# Patient Record
Sex: Female | Born: 1960 | Race: White | Hispanic: No | Marital: Married | Smoking: Former smoker
Health system: Southern US, Community
[De-identification: ages and names within clinical notes are randomized; demographics above are authoritative.]

## PROBLEM LIST (undated history)

## (undated) DIAGNOSIS — E079 Disorder of thyroid, unspecified: Secondary | ICD-10-CM

## (undated) DIAGNOSIS — R011 Cardiac murmur, unspecified: Secondary | ICD-10-CM

## (undated) DIAGNOSIS — IMO0002 Reserved for concepts with insufficient information to code with codable children: Secondary | ICD-10-CM

## (undated) HISTORY — DX: Disorder of thyroid, unspecified: E07.9

## (undated) HISTORY — PX: COSMETIC SURGERY: SHX468

## (undated) HISTORY — PX: KNEE ARTHROSCOPY: SUR90

## (undated) HISTORY — DX: Reserved for concepts with insufficient information to code with codable children: IMO0002

## (undated) HISTORY — PX: THYROID SURGERY: SHX805

## (undated) HISTORY — DX: Cardiac murmur, unspecified: R01.1

---

## 2011-11-09 ENCOUNTER — Ambulatory Visit: Payer: Managed Care, Other (non HMO)

## 2011-11-09 ENCOUNTER — Ambulatory Visit (INDEPENDENT_AMBULATORY_CARE_PROVIDER_SITE_OTHER): Payer: Managed Care, Other (non HMO) | Admitting: Emergency Medicine

## 2011-11-09 VITALS — BP 110/68 | HR 73 | Temp 98.3°F | Resp 18 | Ht 65.0 in | Wt 154.8 lb

## 2011-11-09 DIAGNOSIS — L989 Disorder of the skin and subcutaneous tissue, unspecified: Secondary | ICD-10-CM

## 2011-11-09 DIAGNOSIS — M25539 Pain in unspecified wrist: Secondary | ICD-10-CM

## 2011-11-09 NOTE — Progress Notes (Signed)
Patient ID: Diana Krueger MRN: [id number], DOB: [birth year], 51 y.o. Date of Encounter: 11/09/2011, 6:15 PM  Primary Physician: No primary provider on file.  Chief Complaint: Bump on right forearm for 9 days  HPI: 51 y.o. year old female with history below presents with bump along her right forearm for 9 days. Patient reports playful wrestling with her son 9-10 days prior, while they were playing he grabbed her forearm and this caused her sudden amounts of considerable pain. At that time she did not notice a bump, lesion, or wound. The following day after her shower she was applying lotion and felt a bump along her distal radial shaft. This lesion never became erythematous or ecchymotic. Over the past couple of days she states it seems to becoming to a point. It is tender if she applies moderate pressure to it, or if she applies pressure at the proximal aspect and runs her finger distally over the lesion. She has full range of motion, strength, and sensation of the arm, wrist, and hand. She otherwise feels well. Afebrile.    No past medical history on file.   Home Meds: Prior to Admission medications   Medication Sig Start Date End Date Taking? Authorizing Provider  fluticasone (FLONASE) 50 MCG/ACT nasal spray Place 2 sprays into the nose daily.   Yes Historical Provider, MD  levothyroxine (SYNTHROID, LEVOTHROID) 50 MCG tablet Take 50 mcg by mouth daily.   Yes Historical Provider, MD  omeprazole (PRILOSEC OTC) 20 MG tablet Take 20 mg by mouth daily.   Yes Historical Provider, MD    Allergies: No Known Allergies  History   Social History  . Marital Status: Married    Spouse Name: N/A    Number of Children: N/A  . Years of Education: N/A   Occupational History  . Not on file.   Social History Main Topics  . Smoking status: Former Smoker -- 1.5 packs/day for 21 years    Types: Cigarettes    Quit date: 11/09/1995  . Smokeless tobacco: Not on file  . Alcohol Use: Not on  file  . Drug Use: Not on file  . Sexually Active: Not on file   Other Topics Concern  . Not on file   Social History Narrative  . No narrative on file     Review of Systems: Constitutional: negative for chills, fever, night sweats, weight changes, or fatigue  HEENT: negative for vision changes, hearing loss, congestion, rhinorrhea, ST, epistaxis, or sinus pressure Cardiovascular: negative for chest pain or palpitations Respiratory: negative for hemoptysis, wheezing, shortness of breath, or cough Abdominal: negative for abdominal pain, nausea, vomiting, diarrhea, or constipation Dermatological: negative for rash Neurologic: negative for headache, dizziness, or syncope All other systems reviewed and are otherwise negative with the exception to those above and in the HPI.   Physical Exam: Blood pressure 110/68, pulse 73, temperature 98.3 F (36.8 C), temperature source Oral, resp. rate 18, height 5\' 5"  (1.651 m), weight 154 lb 12.8 oz (70.217 kg), last menstrual period 10/07/2011, SpO2 100.00%., Body mass index is 25.76 kg/(m^2). General: Well developed, well nourished, in no acute distress. Head: Normocephalic, atraumatic, eyes without discharge, sclera non-icteric, nares are without discharge.   Neck: Supple. No thyromegaly. Full ROM. No lymphadenopathy. Lungs: Clear bilaterally to auscultation without wheezes, rales, or rhonchi. Breathing is unlabored. Heart: RRR with S1 S2. No murmurs, rubs, or gallops appreciated. Msk:  Strength and tone normal for age. Extremities/Skin: Right distal radial shaft with a 1/5 cm x 1.5  cm firm mobile nodule along the medial shaft of the right radius. No erythema or ecchymosis. TTP with moderate pressure over the lesion. No TTP proximal or distal. FROM of the arm, wrist and hand. 5/5 strength through out.  Warm and dry. Normal sensation.  Neuro: Alert and oriented X 3. Moves all extremities spontaneously. Gait is normal. CNII-XII grossly in  tact. Psych:  Responds to questions appropriately with a normal affect.   [facility] reading (PRIMARY) by  Dr. Cleta Alberts. Negative   ASSESSMENT AND PLAN:  51 y.o. year old female with hematoma of the right distal right forearm -Warm compresses -Recheck in one week if still present -Patient discussed with and examined by Dr. Cleta Alberts  Signed, Eula Listen, PA-C 11/09/2011 6:15 PM

## 2012-01-27 ENCOUNTER — Encounter: Payer: Self-pay | Admitting: Family Medicine

## 2012-01-27 ENCOUNTER — Ambulatory Visit (INDEPENDENT_AMBULATORY_CARE_PROVIDER_SITE_OTHER): Payer: Managed Care, Other (non HMO) | Admitting: Family Medicine

## 2012-01-27 ENCOUNTER — Ambulatory Visit: Payer: Managed Care, Other (non HMO)

## 2012-01-27 VITALS — BP 109/68 | HR 64 | Temp 98.1°F | Resp 16 | Ht 66.0 in | Wt 148.0 lb

## 2012-01-27 DIAGNOSIS — M25469 Effusion, unspecified knee: Secondary | ICD-10-CM

## 2012-01-27 DIAGNOSIS — M25461 Effusion, right knee: Secondary | ICD-10-CM

## 2012-01-27 DIAGNOSIS — M25569 Pain in unspecified knee: Secondary | ICD-10-CM

## 2012-01-27 DIAGNOSIS — S83419A Sprain of medial collateral ligament of unspecified knee, initial encounter: Secondary | ICD-10-CM

## 2012-01-27 MED ORDER — MELOXICAM 7.5 MG PO TABS: 7.5000 mg | ORAL_TABLET | Freq: Every day | ORAL | Status: DC

## 2012-01-27 MED ORDER — TRAMADOL HCL 50 MG PO TABS: 50.0000 mg | ORAL_TABLET | Freq: Four times a day (QID) | ORAL | Status: DC | PRN

## 2012-01-27 NOTE — Patient Instructions (Signed)
Your knee pain and swelling is likely due to a tear in the meniscus.  meloxicam in the morning (stop this if any stomach upset), ultram up to every 6 hours if needed, but can start with nighttime dosing as it can make you sedated.  Use knee brace, ice as needed for next few days, and elevate. Work on range of motion as able, and recheck in next week, sooner or call if worse, and we can refer you to an orthopaedic specialist.  Return to the clinic or go to the nearest emergency room if any of your symptoms worsen or new symptoms occur.

## 2012-01-27 NOTE — Progress Notes (Signed)
  Subjective:    Patient ID: Diana Krueger, female    DOB: 1961/02/16, 51 y.o.   MRN: [id number]  HPI Diana Krueger is a 51 y.o. female R knee pain - running 5k 2 nights ago.  After 2nd mile - felt like knee locked.  Had to walk, eased off a little, tried to run - locked again.  Had to walk remainder of race.  Swollen, used otc neoprene brace.   Spinning for exercise, not usually running, no known injury/surgery prior.   Walking slowly ok , hurts to stand with full weight on area for awhile, hurts to shift/turn/twist.   Tx: ibuprofen otc.   Review of Systems As above. No rash, no wounds. No ankle or hip pain.     Objective:   Physical Exam  Constitutional: She is oriented to person, place, and time. She appears well-developed and well-nourished. No distress.  Pulmonary/Chest: Effort normal.  Musculoskeletal:       Right hip: She exhibits normal strength, no tenderness and no bony tenderness.       Right knee: She exhibits decreased range of motion (lacks approx 5-10 degrees ext, flex to 100. ), swelling, effusion, abnormal meniscus (pain in medial joint line with Diana Krueger. ) and MCL laxity (pain, guarded with slight bent knee testing. ). She exhibits no ecchymosis, no deformity, no laceration, no erythema, no LCL laxity and no bony tenderness. tenderness found. Medial joint line tenderness noted.       Right ankle: Normal.  Neurological: She is alert and oriented to person, place, and time.  Skin: Skin is warm and dry. No rash noted. No erythema.  Psychiatric: She has a normal mood and affect. Her behavior is normal.   [facility] reading (PRIMARY) by  Dr. Neva Seat: R knee: NAD.      Assessment & Plan:  Diana Krueger is a 51 y.o. female 1. Knee pain  DG Knee Complete 4 Views Right, traMADol (ULTRAM) 50 MG tablet, meloxicam (MOBIC) 7.5 MG tablet  2. Knee MCL sprain    3. Swelling of knee joint, right       Medial knee pain with effusion.  Likely MMT, but  possible grade 1 MCl strain with pain on testing.  Hinged brace, rom as tolerated - handout on meniscus tear, mobic 7.5mg  QAM (stop if GI upset), Ultram qhs prn, and recheck in 1 week, sooner if worse. Crutches discussed - declined, but can use otc cane for stability if needed.     Patient Instructions  Your knee pain and swelling is likely due to a tear in the meniscus.  meloxicam in the morning (stop this if any stomach upset), ultram up to every 6 hours if needed, but can start with nighttime dosing as it can make you sedated.  Use knee brace, ice as needed for next few days, and elevate. Work on range of motion as able, and recheck in next week, sooner or call if worse, and we can refer you to an orthopaedic specialist.  Return to the clinic or go to the nearest emergency room if any of your symptoms worsen or new symptoms occur.

## 2012-01-27 NOTE — Progress Notes (Signed)
.  klm

## 2012-02-03 ENCOUNTER — Ambulatory Visit (INDEPENDENT_AMBULATORY_CARE_PROVIDER_SITE_OTHER): Payer: Managed Care, Other (non HMO) | Admitting: Family Medicine

## 2012-02-03 VITALS — BP 108/76 | HR 83 | Temp 98.2°F | Resp 16 | Ht 66.0 in | Wt 152.6 lb

## 2012-02-03 DIAGNOSIS — S83419A Sprain of medial collateral ligament of unspecified knee, initial encounter: Secondary | ICD-10-CM

## 2012-02-03 DIAGNOSIS — M25569 Pain in unspecified knee: Secondary | ICD-10-CM

## 2012-02-03 DIAGNOSIS — M25461 Effusion, right knee: Secondary | ICD-10-CM

## 2012-02-03 DIAGNOSIS — M25469 Effusion, unspecified knee: Secondary | ICD-10-CM

## 2012-02-03 DIAGNOSIS — M25561 Pain in right knee: Secondary | ICD-10-CM

## 2012-02-03 NOTE — Progress Notes (Signed)
  Subjective:    Patient ID: Diana Krueger, female    DOB: [birth year], 51 y.o.   MRN: [id number]  HPI Diana Krueger is a 51 y.o. female See last ov.  Hx of R knee pain after running 5k race about 9 days ago. Medial knee pain with effusion. Likely MMT, but possible grade 1 MCl strain with pain on testing.  Hinged brace, rom as tolerated - handout on meniscus tear, mobic 7.5mg  QAM (stop if GI upset), Ultram qhs prn,rx at OV 1 week ago.   Knee feeling better,  Used cane as needed over past week. Able to walk ok, no running. Still taking mobic, no ultram in past 2 days.  Still feels a little tight to bend it, but not locking/giving way.  Still some swelling in knee, able to go back to work.  No upper or lower leg pain - just knee.   Review of Systems No calf or thigh pain.    Objective:   Physical Exam  Constitutional: She is oriented to person, place, and time. She appears well-developed and well-nourished.  HENT:  Head: Normocephalic and atraumatic.  Pulmonary/Chest: Effort normal.  Musculoskeletal:       Right knee: She exhibits decreased range of motion (80 flex, full ext.  (vs 60 to 70 flex on L)). tenderness found. Medial joint line and MCL tenderness noted.       Slight medial jt line ttp and pain with McMurray internal rotation and flexion. Slight laxity with valgus.  Min effusion.   Neurological: She is alert and oriented to person, place, and time.  Skin: Skin is warm.     Psychiatric: She has a normal mood and affect. Her behavior is normal.          Assessment & Plan:  Diana Krueger is a 51 y.o. female 1. Knee pain, right   probable MMT with grade 1 mcl strain, improving. Ok to start stationary bike, hep with theraband as tolerated, and cont mobic.  Discussed ortho eval, but would like to continue HEP. recheck in 2 weeks.    Contact derm or friction irritation to posterior r thigh from brace.  stockinette applied - can try this under  brace, but if irritation persist - may need to d/c brace use.

## 2012-02-14 ENCOUNTER — Telehealth: Payer: Self-pay

## 2012-02-14 NOTE — Telephone Encounter (Signed)
Pt has been seeing dr Neva Seat reagrding her right knee pain and today she heard it pop and would like to be referred to an orthopedic provider   Best number [phone]

## 2012-02-14 NOTE — Telephone Encounter (Signed)
You have discussed ortho eval with patient, she now wants to proceed I can put this referral in, if you advise where you want her to be evaluated, and I will call patient back to advise. Amy

## 2012-02-14 NOTE — Telephone Encounter (Signed)
Pt in pain and wants referral for orthopedic.  States that has heard good things about [facility] ortho, but will go wherever Dr. Neva Seat recommends.    [phone] pt contact #

## 2012-02-15 ENCOUNTER — Other Ambulatory Visit: Payer: Self-pay | Admitting: Family Medicine

## 2012-02-17 ENCOUNTER — Other Ambulatory Visit: Payer: Self-pay | Admitting: Family Medicine

## 2012-02-17 DIAGNOSIS — M25561 Pain in right knee: Secondary | ICD-10-CM

## 2012-02-17 NOTE — Telephone Encounter (Signed)
Patient notified and voiced understanding.

## 2012-02-17 NOTE — Telephone Encounter (Signed)
Sure.  We can refer her to [facility] ortho.  i'll put in the order, just call and let pt know to expect a phone call for the referral.

## 2012-02-27 ENCOUNTER — Telehealth: Payer: Self-pay

## 2012-02-27 NOTE — Telephone Encounter (Signed)
Pt requesting latest  X-ray taken here to be carried to [place] [facility] ortho appt   Call [phone] when ready

## 2012-02-28 NOTE — Telephone Encounter (Signed)
Notified pt xray is ready for p/up. 

## 2012-02-28 NOTE — Telephone Encounter (Signed)
I have requested copy to be made from [facility].

## 2012-05-02 ENCOUNTER — Other Ambulatory Visit: Payer: Self-pay | Admitting: Family Medicine

## 2012-05-02 NOTE — Telephone Encounter (Signed)
Please pull chart.

## 2012-05-02 NOTE — Telephone Encounter (Signed)
Chart pulled at pa pool. MR [id number]

## 2012-05-05 ENCOUNTER — Other Ambulatory Visit: Payer: Self-pay | Admitting: Family Medicine

## 2012-05-06 NOTE — Telephone Encounter (Signed)
Chart not at desk, please pull

## 2012-05-08 NOTE — Telephone Encounter (Signed)
Chart pulled to PA pool at nurses station [phone]

## 2012-05-09 ENCOUNTER — Telehealth: Payer: Self-pay

## 2012-05-09 MED ORDER — LEVOTHYROXINE SODIUM 50 MCG PO TABS: 50.0000 ug | ORAL_TABLET | Freq: Every day | ORAL | Status: DC

## 2012-05-09 NOTE — Telephone Encounter (Signed)
She is advised she is due for follow up, states she can come in 2 weeks, she is travelling , I told her I can send in 1 mo supply to last until she can come in.

## 2012-05-09 NOTE — Telephone Encounter (Signed)
PT STATES SHE IS IN NEED OF HER LEVOTHYROXINE, WILL LIKE TO HAVE BY Sunday SINCE SHE IS TRAVELING PLEASE CALL [phone]    [facility] [place]

## 2012-06-06 ENCOUNTER — Ambulatory Visit (INDEPENDENT_AMBULATORY_CARE_PROVIDER_SITE_OTHER): Payer: Managed Care, Other (non HMO) | Admitting: Emergency Medicine

## 2012-06-06 VITALS — BP 100/68 | HR 73 | Temp 98.7°F | Resp 17 | Ht 65.5 in | Wt 153.0 lb

## 2012-06-06 DIAGNOSIS — E039 Hypothyroidism, unspecified: Secondary | ICD-10-CM

## 2012-06-06 DIAGNOSIS — D649 Anemia, unspecified: Secondary | ICD-10-CM

## 2012-06-06 DIAGNOSIS — Z800 Family history of malignant neoplasm of digestive organs: Secondary | ICD-10-CM

## 2012-06-06 LAB — POCT CBC
HCT, POC: 35 % — AB (ref 37.7–47.9)
MCH, POC: 29.6 pg (ref 27–31.2)
MCV: 94 fL (ref 80–97)
MID (cbc): 0.3 (ref 0–0.9)
POC LYMPH PERCENT: 34.4 %L (ref 10–50)
Platelet Count, POC: 347 10*3/uL (ref 142–424)
RDW, POC: 14.8 %
WBC: 5.7 10*3/uL (ref 4.6–10.2)

## 2012-06-06 MED ORDER — LEVOTHYROXINE SODIUM 50 MCG PO TABS: 50.0000 ug | ORAL_TABLET | Freq: Every day | ORAL | Status: DC

## 2012-06-06 NOTE — Progress Notes (Signed)
  Subjective:    Patient ID: Diana Krueger, female    DOB: November 26, 1960, 52 y.o.   MRN: [id number]  HPI pt here for rx refill for synthroid. Her PCP is dr hopper. Patient needs a refill on her Synthroid medication.    Review of Systems patient states she's still having her menstrual 8 years ago she had colonoscopy because of family history of colon cancer. Her insurance is to take effect after April 7     Objective:   Physical Exam neck is supple without thyromegaly chest is clear cardiac unremarkable abdomen without mass.    Results for orders placed in visit on 06/06/12  POCT CBC      Component Value Range   WBC 5.7  4.6 - 10.2 K/uL   Lymph, poc 2.0  0.6 - 3.4   POC LYMPH PERCENT 34.4  10 - 50 %L   MID (cbc) 0.3  0 - 0.9   POC MID % 5.5  0 - 12 %M   POC Granulocyte 3.4  2 - 6.9   Granulocyte percent 60.1  37 - 80 %G   RBC 3.72 (*) 4.04 - 5.48 M/uL   Hemoglobin 11.0 (*) 12.2 - 16.2 g/dL   HCT, POC 46.9 (*) 62.9 - 47.9 %   MCV 94.0  80 - 97 fL   MCH, POC 29.6  27 - 31.2 pg   MCHC 31.4 (*) 31.8 - 35.4 g/dL   RDW, POC 52.8     Platelet Count, POC 347  142 - 424 K/uL   MPV 8.3  0 - 99.8 fL      Assessment & Plan:  I did go ahead and refill her Synthroid that I can and just the dose once her labs come back. I did go ahead and make a referral to GI for evaluation for colonoscopy. This was because she is borderline anemic with a hemoglobin of 11. She is to take an iron pill one a day.

## 2012-06-09 ENCOUNTER — Telehealth: Payer: Self-pay

## 2012-06-09 DIAGNOSIS — E039 Hypothyroidism, unspecified: Secondary | ICD-10-CM

## 2012-06-09 MED ORDER — LEVOTHYROXINE SODIUM 50 MCG PO TABS: 50.0000 ug | ORAL_TABLET | Freq: Every day | ORAL | Status: DC

## 2012-06-09 NOTE — Telephone Encounter (Signed)
LMOM to Cb with specific details on which meds she needs 90 day supply on.

## 2012-06-09 NOTE — Telephone Encounter (Signed)
Patient wanted to get medicines for 3 months if possible.

## 2012-06-09 NOTE — Telephone Encounter (Signed)
Pt CB and reported that she just needs her levothyroxine changed to 90 day supplies and sent to [facility]. Checked w/Dr Cleta Alberts and he approved 1 year of RFs at 90 day supplies. Sent to [facility].

## 2012-09-10 ENCOUNTER — Other Ambulatory Visit: Payer: Self-pay | Admitting: Family Medicine

## 2013-02-10 ENCOUNTER — Ambulatory Visit (INDEPENDENT_AMBULATORY_CARE_PROVIDER_SITE_OTHER): Payer: Managed Care, Other (non HMO) | Admitting: Family Medicine

## 2013-02-10 VITALS — BP 120/82 | HR 71 | Temp 98.4°F | Resp 17 | Ht 66.0 in | Wt 154.0 lb

## 2013-02-10 DIAGNOSIS — K219 Gastro-esophageal reflux disease without esophagitis: Secondary | ICD-10-CM

## 2013-02-10 DIAGNOSIS — J309 Allergic rhinitis, unspecified: Secondary | ICD-10-CM

## 2013-02-10 DIAGNOSIS — Z0000 Encounter for general adult medical examination without abnormal findings: Secondary | ICD-10-CM

## 2013-02-10 DIAGNOSIS — L259 Unspecified contact dermatitis, unspecified cause: Secondary | ICD-10-CM

## 2013-02-10 DIAGNOSIS — L239 Allergic contact dermatitis, unspecified cause: Secondary | ICD-10-CM

## 2013-02-10 DIAGNOSIS — E039 Hypothyroidism, unspecified: Secondary | ICD-10-CM

## 2013-02-10 MED ORDER — LEVOTHYROXINE SODIUM 50 MCG PO TABS: 50.0000 ug | ORAL_TABLET | Freq: Every day | ORAL | Status: DC

## 2013-02-10 MED ORDER — OMEPRAZOLE 40 MG PO CPDR: 40.0000 mg | DELAYED_RELEASE_CAPSULE | Freq: Every day | ORAL | Status: DC

## 2013-02-10 MED ORDER — TRIAMCINOLONE ACETONIDE 0.1 % EX CREA: TOPICAL_CREAM | Freq: Two times a day (BID) | CUTANEOUS | Status: DC

## 2013-02-10 MED ORDER — FLUTICASONE PROPIONATE 50 MCG/ACT NA SUSP: 2.0000 | Freq: Every day | NASAL | Status: DC

## 2013-02-10 NOTE — Progress Notes (Signed)
Annual physical examination:   History: Patient is here for physical examination. She has no major acute complaints. It was just time she get an exam done. Her last Pap smear was less than 2 years ago and was entirely normal. We talked about the guidelines which have some variability depending on which agency, but with a normal Pap at her age in a stable marital relationship she is agreeable to every 4-5 years getting up at.  Past medical history: She has chronic gastritis or reflux her PUD symptom. Her endoscopy just showed some inflammation. She has hypothyroidism, having had half of her thyroid removed many years ago. Social history: Partial thyroidectomy. Meniscus surgery on knee. Recent endoscopies. Current physicians: Dr. Alwyn Ren, Dr. Loreta Ave, and Dr. Moishe Spice Allergy: None Medications: See list  Social history: She is married. No children. Does not smoke or use drugs. She drinks some wine to 3 times a week. Grade school education. 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 In [place]. Does not get enough exercise, but does exercise.  Family history: Father is deceased with colon cancer. Mother is living with hypertension but very active and healthy  Review of systems: Constitutional: Unremarkable Dermatologic: Unremarkable HEENT: Unremarkable. Gets regular eye exams Respiratory: Unremarkable Cardiovascular: Unremarkable Gastrointestinal: Chronic heartburn for many years. Has hemorrhoids which cannot be problem to her. Endocrine: Has had a thyroid surgery and is on thyroid replacement Hematologic: History of anemia which has been evaluated Genitourinary: Unremarkable Gynecologic: Unremarkable The skeletal: Stiffness of her joints, especially neck and knees Neurologic: Headaches,. Related Psychiatric: Unremarkable   Physical examination Well-developed well-nourished lady in no acute distress. TMs are. Eyes PERRLA. Fundi benign. Throat clear. Neck supple without nodes thyromegaly. No  carotid bruits. Chest is clear to auscultation. Heart regular without murmurs. Breasts soft without masses. Had a mammogram today. And soft without mass or tenderness. Pelvic deferred. Throat is unremarkable. Skin unremarkable. Has a lot of seborrheic keratoses.  Assessment: Annual physical examination Hypothyroidism Heartburn/GERD History of anemia  Plan: Check labs. Return in 6 months. Increase the omeprazole to 40 mg daily, other things are the same. Refills of triamcinolone that she has for periodic eczema she gets on her ears

## 2013-02-10 NOTE — Patient Instructions (Signed)
We will repeat your Pap smear in 2-3 years unless problems arise sooner  Continue current medications  Increase the omeprazole to 40 mg daily to see if that will help your stomach better  If you do not hear back from your labs over the next week please contact us.

## 2013-02-11 LAB — COMPREHENSIVE METABOLIC PANEL WITH GFR
ALT: 12 U/L (ref 0–35)
AST: 17 U/L (ref 0–37)
Albumin: 4.6 g/dL (ref 3.5–5.2)
Alkaline Phosphatase: 31 U/L — ABNORMAL LOW (ref 39–117)
BUN: 8 mg/dL (ref 6–23)
CO2: 30 meq/L (ref 19–32)
Calcium: 9.7 mg/dL (ref 8.4–10.5)
Chloride: 101 meq/L (ref 96–112)
Creat: 0.7 mg/dL (ref 0.50–1.10)
Glucose, Bld: 89 mg/dL (ref 70–99)
Potassium: 4.1 meq/L (ref 3.5–5.3)
Sodium: 139 meq/L (ref 135–145)
Total Bilirubin: 0.3 mg/dL (ref 0.3–1.2)
Total Protein: 7.6 g/dL (ref 6.0–8.3)

## 2013-02-11 LAB — LIPID PANEL
Cholesterol: 212 mg/dL — ABNORMAL HIGH (ref 0–200)
HDL: 61 mg/dL
LDL Cholesterol: 137 mg/dL — ABNORMAL HIGH (ref 0–99)
Total CHOL/HDL Ratio: 3.5 ratio
Triglycerides: 72 mg/dL
VLDL: 14 mg/dL (ref 0–40)

## 2013-02-11 LAB — CBC
HCT: 36.3 % (ref 36.0–46.0)
Hemoglobin: 12.3 g/dL (ref 12.0–15.0)
MCH: 30.7 pg (ref 26.0–34.0)
MCHC: 33.9 g/dL (ref 30.0–36.0)
MCV: 90.5 fL (ref 78.0–100.0)
Platelets: 361 10*3/uL (ref 150–400)
RBC: 4.01 MIL/uL (ref 3.87–5.11)
RDW: 13.8 % (ref 11.5–15.5)
WBC: 8.6 10*3/uL (ref 4.0–10.5)

## 2013-03-31 ENCOUNTER — Encounter: Payer: Self-pay | Admitting: Family Medicine

## 2013-08-24 ENCOUNTER — Ambulatory Visit (INDEPENDENT_AMBULATORY_CARE_PROVIDER_SITE_OTHER): Payer: Managed Care, Other (non HMO) | Admitting: Family Medicine

## 2013-08-24 VITALS — BP 98/64 | HR 73 | Temp 98.5°F | Resp 16 | Ht 65.0 in | Wt 151.6 lb

## 2013-08-24 DIAGNOSIS — B029 Zoster without complications: Secondary | ICD-10-CM

## 2013-08-24 MED ORDER — VALACYCLOVIR HCL 1 G PO TABS: 1000.0000 mg | ORAL_TABLET | Freq: Three times a day (TID) | ORAL | Status: DC

## 2013-08-24 NOTE — Progress Notes (Signed)
   Subjective:    Patient ID: Diana Krueger, female    DOB: Apr 04, 1961, 53 y.o.   MRN: [id number]  HPI Patient noticed a single, raised, red area on her lower back 2 days ago. She noticed two red lesions yesterday- one on her right, upper, inner thigh and one on her right anterior leg above her knee. When she awoke this morning, she had a red, raised rash all over her right leg and buttock. Noticed blisters on the two lesions that appeared yesterday. She has felt burning on her right leg and had difficulty sleeping last night because she felt her leg was hot. She has recently returned from visiting her mother in [place] where she pulled a muscle in her back. She was diagnosed with a pinched nerve and is currently being seen by a chiropractor. She had numbness and tingling in her right leg prior to the lesions appearing. The burning that started several days ago has been different. She had recently taken Tramadol with good pain relief. It upset her stomach and she has stopped it.  Review of Systems Had a cortisone shot in right foot 3 weeks ago, has not been on prednisone. No fever. Has diarrhea and stomach upset yesterday.    Objective:   Physical Exam  Constitutional: She is oriented to person, place, and time. She appears well-developed and well-nourished. No distress.  HENT:  Head: Normocephalic.  Eyes: Conjunctivae are normal. Right eye exhibits no discharge. Left eye exhibits no discharge.  Neck: Normal range of motion.  Pulmonary/Chest: Effort normal.  Musculoskeletal: Normal range of motion.  Neurological: She is alert and oriented to person, place, and time.  Skin: Skin is warm and dry. Rash noted. She is not diaphoretic.  Patient with 3 cm round, raised, erythematous area on lower back. Right leg with multiple erythematous lesions, several with pustules.  Psychiatric: She has a normal mood and affect. Her behavior is normal. Judgment and thought content normal.         Assessment & Plan:  1. Shingles - valACYclovir (VALTREX) 1000 MG tablet; Take 1 tablet (1,000 mg total) by mouth 3 (three) times daily.  Dispense: 30 tablet; Refill: 0 -offered patient medication for pain/sleep, she feels that her pain is manageable. Told her she can call if she needs something for pain. -Provided written and verbal instructions including when to return if worsening.   Elby Beck, FNP-BC  [facility]  08/24/2013 10:44 AM

## 2013-08-24 NOTE — Patient Instructions (Signed)
Shingles Shingles (herpes zoster) is an infection that is caused by the same virus that causes chickenpox (varicella). The infection causes a painful skin rash and fluid-filled blisters, which eventually break open, crust over, and heal. It may occur in any area of the body, but it usually affects only one side of the body or face. The pain of shingles usually lasts about 1 month. However, some people with shingles may develop long-term (chronic) pain in the affected area of the body. Shingles often occurs many years after the person had chickenpox. It is more common:  In people older than 50 years.  In people with weakened immune systems, such as those with HIV, AIDS, or cancer.  In people taking medicines that weaken the immune system, such as transplant medicines.  In people under great stress. CAUSES  Shingles is caused by the varicella zoster virus (VZV), which also causes chickenpox. After a person is infected with the virus, it can remain in the person's body for years in an inactive state (dormant). To cause shingles, the virus reactivates and breaks out as an infection in a nerve root. The virus can be spread from person to person (contagious) through contact with open blisters of the shingles rash. It will only spread to people who have not had chickenpox. When these people are exposed to the virus, they may develop chickenpox. They will not develop shingles. Once the blisters scab over, the person is no longer contagious and cannot spread the virus to others. SYMPTOMS  Shingles shows up in stages. The initial symptoms may be pain, itching, and tingling in an area of the skin. This pain is usually described as burning, stabbing, or throbbing.In a few days or weeks, a painful red rash will appear in the area where the pain, itching, and tingling were felt. The rash is usually on one side of the body in a band or belt-like pattern. Then, the rash usually turns into fluid-filled blisters. They  will scab over and dry up in approximately 2 3 weeks. Flu-like symptoms may also occur with the initial symptoms, the rash, or the blisters. These may include:  Fever.  Chills.  Headache.  Upset stomach. DIAGNOSIS  Your caregiver will perform a skin exam to diagnose shingles. Skin scrapings or fluid samples may also be taken from the blisters. This sample will be examined under a microscope or sent to a lab for further testing. TREATMENT  There is no specific cure for shingles. Your caregiver will likely prescribe medicines to help you manage the pain, recover faster, and avoid long-term problems. This may include antiviral drugs, anti-inflammatory drugs, and pain medicines. HOME CARE INSTRUCTIONS   Take a cool bath or apply cool compresses to the area of the rash or blisters as directed. This may help with the pain and itching.   Only take over-the-counter or prescription medicines as directed by your caregiver.   Rest as directed by your caregiver.  Keep your rash and blisters clean with mild soap and cool water or as directed by your caregiver.  Do not pick your blisters or scratch your rash. Apply an anti-itch cream or numbing creams to the affected area as directed by your caregiver.  Keep your shingles rash covered with a loose bandage (dressing).  Avoid skin contact with:  Babies.   Pregnant women.   Children with eczema.   Elderly people with transplants.   People with chronic illnesses, such as leukemia or AIDS.   Wear loose-fitting clothing to help ease   the pain of material rubbing against the rash.  Keep all follow-up appointments with your caregiver.If the area involved is on your face, you may receive a referral for follow-up to a specialist, such as an eye doctor (ophthalmologist) or an ear, nose, and throat (ENT) doctor. Keeping all follow-up appointments will help you avoid eye complications, chronic pain, or disability.  SEEK IMMEDIATE MEDICAL  CARE IF:   You have facial pain, pain around the eye area, or loss of feeling on one side of your face.  You have ear pain or ringing in your ear.  You have loss of taste.  Your pain is not relieved with prescribed medicines.   Your redness or swelling spreads.   You have more pain and swelling.  Your condition is worsening or has changed.   You have a feveror persistent symptoms for more than 2 3 days.  You have a fever and your symptoms suddenly get worse. MAKE SURE YOU:  Understand these instructions.  Will watch your condition.  Will get help right away if you are not doing well or get worse. Document Released: 04/23/2005 Document Revised: 01/16/2012 Document Reviewed: 12/06/2011 Exit[employer] Patient Information 2014 [employer].  

## 2013-08-25 NOTE — Progress Notes (Signed)
I have discussed this case with Ms. Gessner, NP and agree.  

## 2014-02-15 ENCOUNTER — Ambulatory Visit (INDEPENDENT_AMBULATORY_CARE_PROVIDER_SITE_OTHER): Payer: Managed Care, Other (non HMO)

## 2014-02-15 ENCOUNTER — Ambulatory Visit (INDEPENDENT_AMBULATORY_CARE_PROVIDER_SITE_OTHER): Payer: Managed Care, Other (non HMO) | Admitting: Internal Medicine

## 2014-02-15 VITALS — BP 116/72 | HR 60 | Temp 98.6°F | Resp 17 | Ht 65.0 in | Wt 156.0 lb

## 2014-02-15 DIAGNOSIS — Z0000 Encounter for general adult medical examination without abnormal findings: Secondary | ICD-10-CM

## 2014-02-15 DIAGNOSIS — E039 Hypothyroidism, unspecified: Secondary | ICD-10-CM

## 2014-02-15 DIAGNOSIS — N939 Abnormal uterine and vaginal bleeding, unspecified: Secondary | ICD-10-CM

## 2014-02-15 DIAGNOSIS — Z2882 Immunization not carried out because of caregiver refusal: Secondary | ICD-10-CM | POA: Insufficient documentation

## 2014-02-15 DIAGNOSIS — D649 Anemia, unspecified: Secondary | ICD-10-CM

## 2014-02-15 LAB — POCT UA - MICROSCOPIC ONLY
Bacteria, U Microscopic: NEGATIVE
CASTS, UR, LPF, POC: NEGATIVE
Crystals, Ur, HPF, POC: NEGATIVE
Mucus, UA: NEGATIVE
RBC, urine, microscopic: NEGATIVE
YEAST UA: NEGATIVE

## 2014-02-15 LAB — COMPREHENSIVE METABOLIC PANEL
ALK PHOS: 30 U/L — AB (ref 39–117)
ALT: 11 U/L (ref 0–35)
AST: 18 U/L (ref 0–37)
Albumin: 4.3 g/dL (ref 3.5–5.2)
BUN: 10 mg/dL (ref 6–23)
CO2: 21 mEq/L (ref 19–32)
CREATININE: 0.76 mg/dL (ref 0.50–1.10)
Calcium: 9.3 mg/dL (ref 8.4–10.5)
Chloride: 104 mEq/L (ref 96–112)
Glucose, Bld: 90 mg/dL (ref 70–99)
Potassium: 4 mEq/L (ref 3.5–5.3)
Sodium: 137 mEq/L (ref 135–145)
Total Bilirubin: 0.6 mg/dL (ref 0.2–1.2)
Total Protein: 7.4 g/dL (ref 6.0–8.3)

## 2014-02-15 LAB — LIPID PANEL
Cholesterol: 175 mg/dL (ref 0–200)
HDL: 58 mg/dL (ref >39)
LDL Cholesterol: 101 mg/dL — ABNORMAL HIGH (ref 0–99)
TRIGLYCERIDES: 78 mg/dL (ref <150)
Total CHOL/HDL Ratio: 3 Ratio
VLDL: 16 mg/dL (ref 0–40)

## 2014-02-15 LAB — POCT URINALYSIS DIPSTICK
Bilirubin, UA: NEGATIVE
GLUCOSE UA: NEGATIVE
Ketones, UA: NEGATIVE
Leukocytes, UA: NEGATIVE
NITRITE UA: NEGATIVE
PH UA: 7
PROTEIN UA: NEGATIVE
RBC UA: NEGATIVE
Spec Grav, UA: 1.015
UROBILINOGEN UA: 0.2

## 2014-02-15 LAB — TSH: TSH: 1.623 u[IU]/mL (ref 0.350–4.500)

## 2014-02-15 LAB — POCT CBC
Granulocyte percent: 52.8 %G (ref 37–80)
HEMATOCRIT: 34.7 % — AB (ref 37.7–47.9)
Hemoglobin: 11 g/dL — AB (ref 12.2–16.2)
Lymph, poc: 3 (ref 0.6–3.4)
MCH, POC: 30.3 pg (ref 27–31.2)
MCHC: 31.7 g/dL — AB (ref 31.8–35.4)
MCV: 95.6 fL (ref 80–97)
MID (cbc): 0.6 (ref 0–0.9)
MPV: 8.3 fL (ref 0–99.8)
POC GRANULOCYTE: 4.1 (ref 2–6.9)
POC LYMPH PERCENT: 39.4 %L (ref 10–50)
POC MID %: 7.8 %M (ref 0–12)
Platelet Count, POC: 253 10*3/uL (ref 142–424)
RBC: 3.63 M/uL — AB (ref 4.04–5.48)
RDW, POC: 16.7 %
WBC: 7.7 10*3/uL (ref 4.6–10.2)

## 2014-02-15 MED ORDER — LEVOTHYROXINE SODIUM 50 MCG PO TABS: 50.0000 ug | ORAL_TABLET | Freq: Every day | ORAL | Status: AC

## 2014-02-15 NOTE — Progress Notes (Signed)
Subjective:    Patient ID: Diana Krueger, female    DOB: July 12, 1960, 53 y.o.   MRN: [id number]  HPI Needs a complete physical, refuses all immunizations, has no medical issues now. Family hx of colon cancer, she had polps removed 2 years ago. Mammogam UTD. History of high cholesterol./Runs for exercise.  Review of Systems  Constitutional: Negative.   HENT: Negative.   Eyes: Negative.   Respiratory: Negative.   Cardiovascular: Negative.   Gastrointestinal: Negative.   Endocrine: Negative.   Genitourinary: Positive for frequency. Negative for hematuria, vaginal bleeding, difficulty urinating and pelvic pain.  Musculoskeletal: Positive for back pain and neck pain. Negative for gait problem and myalgias.  Allergic/Immunologic: Positive for environmental allergies.  Neurological: Negative.   Hematological: Negative.   Psychiatric/Behavioral: Negative.        Objective:   Physical Exam  Constitutional: She is oriented to person, place, and time. She appears well-developed and well-nourished. No distress.  HENT:  Head: Normocephalic.  Right Ear: External ear normal.  Left Ear: External ear normal.  Nose: Nose normal.  Mouth/Throat: Oropharynx is clear and moist.  Eyes: Conjunctivae and EOM are normal. Pupils are equal, round, and reactive to light.  Neck: Normal range of motion. Neck supple. No tracheal deviation present. No thyromegaly present.  Cardiovascular: Normal rate, regular rhythm, normal heart sounds and intact distal pulses.   Pulmonary/Chest: Effort normal and breath sounds normal. No respiratory distress. She exhibits no tenderness.  Abdominal: Soft. Bowel sounds are normal. She exhibits no mass. There is no tenderness.  Musculoskeletal: Normal range of motion. She exhibits tenderness.  Lymphadenopathy:    She has no cervical adenopathy.  Neurological: She is alert and oriented to person, place, and time. She has normal reflexes. No cranial nerve deficit.  She exhibits normal muscle tone. Coordination normal.  Skin: No rash noted.  Psychiatric: She has a normal mood and affect. Her behavior is normal. Judgment and thought content normal.   Results for orders placed in visit on 02/15/14  POCT CBC      Result Value Ref Range   WBC 7.7  4.6 - 10.2 K/uL   Lymph, poc 3.0  0.6 - 3.4   POC LYMPH PERCENT 39.4  10 - 50 %L   MID (cbc) 0.6  0 - 0.9   POC MID % 7.8  0 - 12 %M   POC Granulocyte 4.1  2 - 6.9   Granulocyte percent 52.8  37 - 80 %G   RBC 3.63 (*) 4.04 - 5.48 M/uL   Hemoglobin 11.0 (*) 12.2 - 16.2 g/dL   HCT, POC 34.7 (*) 37.7 - 47.9 %   MCV 95.6  80 - 97 fL   MCH, POC 30.3  27 - 31.2 pg   MCHC 31.7 (*) 31.8 - 35.4 g/dL   RDW, POC 16.7     Platelet Count, POC 253  142 - 424 K/uL   MPV 8.3  0 - 99.8 fL  POCT UA - MICROSCOPIC ONLY      Result Value Ref Range   WBC, Ur, HPF, POC 0-2     RBC, urine, microscopic neg     Bacteria, U Microscopic neg     Mucus, UA neg     Epithelial cells, urine per micros 0-1     Crystals, Ur, HPF, POC neg     Casts, Ur, LPF, POC neg     Yeast, UA neg    POCT URINALYSIS DIPSTICK      Result  Value Ref Range   Color, UA yellow     Clarity, UA clear     Glucose, UA neg     Bilirubin, UA neg     Ketones, UA neg     Spec Grav, UA 1.015     Blood, UA neg     pH, UA 7.0     Protein, UA neg     Urobilinogen, UA 0.2     Nitrite, UA neg     Leukocytes, UA Negative     Former smoker  [facility] reading (PRIMARY) by  Dr Leretha Dykes is normal  EKG normal     Assessment & Plan:  Refill meds one year/Hypothyroid Vaccines refused at this time Anemia returned/Still has regular menses/Will get gyn consult due to her age and concerns

## 2014-02-15 NOTE — Patient Instructions (Addendum)
Immunization Schedule, Adult  Influenza vaccine.  All adults should be immunized every year.  All adults, including pregnant women and people with hives-only allergy to eggs can receive the inactivated influenza (IIV) vaccine.  Adults aged 54-49 years can receive the recombinant influenza (RIV) vaccine. The RIV vaccine does not contain any egg protein.  Adults aged 76 years or older can receive the standard-dose IIV or the high-dose IIV.  Tetanus, diphtheria, and acellular pertussis (Td, Tdap) vaccine.  Pregnant women should receive 1 dose of Tdap vaccine during each pregnancy. The dose should be obtained regardless of the length of time since the last dose. Immunization is preferred during the 27th to 36th week of gestation.  An adult who has not previously received Tdap or who does not know his or her vaccine status should receive 1 dose of Tdap. This initial dose should be followed by tetanus and diphtheria toxoids (Td) booster doses every 10 years.  Adults with an unknown or incomplete history of completing a 3-dose immunization series with Td-containing vaccines should begin or complete a primary immunization series including a Tdap dose.  Adults should receive a Td booster every 10 years.  Varicella vaccine.  An adult without evidence of immunity to varicella should receive 2 doses or a second dose if he or she has previously received 1 dose.  Pregnant females who do not have evidence of immunity should receive the first dose after pregnancy. This first dose should be obtained before leaving the health care facility. The second dose should be obtained 4-8 weeks after the first dose.  Human papillomavirus (HPV) vaccine.  Females aged 13-26 years who have not received the vaccine previously should obtain the 3-dose series.  The vaccine is not recommended for use in pregnant females. However, pregnancy testing is not needed before receiving a dose. If a female is found to be  pregnant after receiving a dose, no treatment is needed. In that case, the remaining doses should be delayed until after the pregnancy.  Males aged 37-21 years who have not received the vaccine previously should receive the 3-dose series. Males aged 22-26 years may be immunized.  Immunization is recommended through the age of 93 years for any female who has sex with males and did not get any or all doses earlier.  Immunization is recommended for any person with an immunocompromised condition through the age of 71 years if he or she did not get any or all doses earlier.  During the 3-dose series, the second dose should be obtained 4-8 weeks after the first dose. The third dose should be obtained 24 weeks after the first dose and 16 weeks after the second dose.  Zoster vaccine.  One dose is recommended for adults aged 73 years or older unless certain conditions are present.  Measles, mumps, and rubella (MMR) vaccine.  Adults born before 35 generally are considered immune to measles and mumps.  Adults born in [birth year] or later should have 1 or more doses of MMR vaccine unless there is a contraindication to the vaccine or there is laboratory evidence of immunity to each of the three diseases.  A routine second dose of MMR vaccine should be obtained at least 28 days after the first dose for students attending postsecondary schools, health care workers, or international travelers.  People who received inactivated measles vaccine or an unknown type of measles vaccine during 1963-1967 should receive 2 doses of MMR vaccine.  People who received inactivated mumps vaccine or an unknown type  of mumps vaccine before 1979 and are at high risk for mumps infection should consider immunization with 2 doses of MMR vaccine.  For females of childbearing age, rubella immunity should be determined. If there is no evidence of immunity, females who are not pregnant should be vaccinated. If there is no evidence of  immunity, females who are pregnant should delay immunization until after pregnancy.  Unvaccinated health care workers born before 1957 who lack laboratory evidence of measles, mumps, or rubella immunity or laboratory confirmation of disease should consider measles and mumps immunization with 2 doses of MMR vaccine or rubella immunization with 1 dose of MMR vaccine.  Pneumococcal 13-valent conjugate (PCV13) vaccine.  When indicated, a person who is uncertain of his or her immunization history and has no record of immunization should receive the PCV13 vaccine.  An adult aged 19 years or older who has certain medical conditions and has not been previously immunized should receive 1 dose of PCV13 vaccine. This PCV13 should be followed with a dose of pneumococcal polysaccharide (PPSV23) vaccine. The PPSV23 vaccine dose should be obtained at least 8 weeks after the dose of PCV13 vaccine.  An adult aged 19 years or older who has certain medical conditions and previously received 1 or more doses of PPSV23 vaccine should receive 1 dose of PCV13. The PCV13 vaccine dose should be obtained 1 or more years after the last PPSV23 vaccine dose.  Pneumococcal polysaccharide (PPSV23) vaccine.  When PCV13 is also indicated, PCV13 should be obtained first.  All adults aged 65 years and older should be immunized.  An adult younger than age 65 years who has certain medical conditions should be immunized.  Any person who resides in a nursing home or long-term care facility should be immunized.  An adult smoker should be immunized.  People with an immunocompromised condition and certain other conditions should receive both PCV13 and PPSV23 vaccines.  People with human immunodeficiency virus (HIV) infection should be immunized as soon as possible after diagnosis.  Immunization during chemotherapy or radiation therapy should be avoided.  Routine use of PPSV23 vaccine is not recommended for American Indians,  [place]atives, or people younger than 65 years unless there are medical conditions that require PPSV23 vaccine.  When indicated, people who have unknown immunization and have no record of immunization should receive PPSV23 vaccine.  One-time revaccination 5 years after the first dose of PPSV23 is recommended for people aged 19-64 years who have chronic kidney failure, nephrotic syndrome, asplenia, or immunocompromised conditions.  People who received 1-2 doses of PPSV23 before age 65 years should receive another dose of PPSV23 vaccine at age 65 years or later if at least 5 years have passed since the previous dose.  Doses of PPSV23 are not needed for people immunized with PPSV23 at or after age 65 years.  Meningococcal vaccine.  Adults with asplenia or persistent complement component deficiencies should receive 2 doses of quadrivalent meningococcal conjugate (MenACWY-D) vaccine. The doses should be obtained at least 2 months apart.  Microbiologists working with certain meningococcal bacteria, military recruits, people at risk during an outbreak, and people who travel to or live in countries with a high rate of meningitis should be immunized.  A first-year college student up through age 21 years who is living in a residence hall should receive a dose if he or she did not receive a dose on or after his or her 16th birthday.  Adults who have certain high-risk conditions should receive one or more doses   of vaccine.  Hepatitis A vaccine.  Adults who wish to be protected from this disease, have certain high-risk conditions, work with hepatitis A-infected animals, work in hepatitis A research labs, or travel to or work in countries with a high rate of hepatitis A should be immunized.  Adults who were previously unvaccinated and who anticipate close contact with an international adoptee during the first 60 days after arrival in the [place] from a country with a high rate of hepatitis A should  be immunized.  Hepatitis B vaccine.  Adults who wish to be protected from this disease, have certain high-risk conditions, may be exposed to blood or other infectious body fluids, are household contacts or sex partners of hepatitis B positive people, are clients or workers in certain care facilities, or travel to or work in countries with a high rate of hepatitis B should be immunized.  Haemophilus influenzae type b (Hib) vaccine.  A previously unvaccinated person with asplenia or sickle cell disease or having a scheduled splenectomy should receive 1 dose of Hib vaccine.  Regardless of previous immunization, a recipient of a hematopoietic stem cell transplant should receive a 3-dose series 6-12 months after his or her successful transplant.  Hib vaccine is not recommended for adults with HIV infection. Document Released: 07/14/2003 Document Revised: 08/18/2012 Document Reviewed: 06/10/2012 [facility] Patient Information 2015 [place], [place]. This information is not intended to replace advice given to you by your health care provider. Make sure you discuss any questions you have with your health care provider. Hypothyroidism The thyroid is a large gland located in the lower front of your neck. The thyroid gland helps control metabolism. Metabolism is how your body handles food. It controls metabolism with the hormone thyroxine. When this gland is underactive (hypothyroid), it produces too little hormone.  CAUSES These include:   Absence or destruction of thyroid tissue.  Goiter due to iodine deficiency.  Goiter due to medications.  Congenital defects (since birth).  Problems with the pituitary. This causes a lack of TSH (thyroid stimulating hormone). This hormone tells the thyroid to turn out more hormone. SYMPTOMS  Lethargy (feeling as though you have no energy)  Cold intolerance  Weight gain (in spite of normal food intake)  Dry skin  Coarse hair  Menstrual irregularity (if  severe, may lead to infertility)  Slowing of thought processes Cardiac problems are also caused by insufficient amounts of thyroid hormone. Hypothyroidism in the newborn is cretinism, and is an extreme form. It is important that this form be treated adequately and immediately or it will lead rapidly to retarded physical and mental development. DIAGNOSIS  To prove hypothyroidism, your caregiver may do blood tests and ultrasound tests. Sometimes the signs are hidden. It may be necessary for your caregiver to watch this illness with blood tests either before or after diagnosis and treatment. TREATMENT  Low levels of thyroid hormone are increased by using synthetic thyroid hormone. This is a safe, effective treatment. It usually takes about four weeks to gain the full effects of the medication. After you have the full effect of the medication, it will generally take another four weeks for problems to leave. Your caregiver may start you on low doses. If you have had heart problems the dose may be gradually increased. It is generally not an emergency to get rapidly to normal. HOME CARE INSTRUCTIONS   Take your medications as your caregiver suggests. Let your caregiver know of any medications you are taking or start taking. Your caregiver  will help you with dosage schedules.  As your condition improves, your dosage needs may increase. It will be necessary to have continuing blood tests as suggested by your caregiver.  Report all suspected medication side effects to your caregiver. SEEK MEDICAL CARE IF: Seek medical care if you develop:  Sweating.  Tremulousness (tremors).  Anxiety.  Rapid weight loss.  Heat intolerance.  Emotional swings.  Diarrhea.  Weakness. SEEK IMMEDIATE MEDICAL CARE IF:  You develop chest pain, an irregular heart beat (palpitations), or a rapid heart beat. MAKE SURE YOU:   Understand these instructions.  Will watch your condition.  Will get help right away if  you are not doing well or get worse. Document Released: 04/23/2005 Document Revised: 07/16/2011 Document Reviewed: 12/12/2007 [facility] Patient Information 2015 [place], [place]. This information is not intended to replace advice given to you by your health care provider. Make sure you discuss any questions you have with your health care provider. DASH Eating Plan DASH stands for "Dietary Approaches to Stop Hypertension." The DASH eating plan is a healthy eating plan that has been shown to reduce high blood pressure (hypertension). Additional health benefits may include reducing the risk of type 2 diabetes mellitus, heart disease, and stroke. The DASH eating plan may also help with weight loss. WHAT DO I NEED TO KNOW ABOUT THE DASH EATING PLAN? For the DASH eating plan, you will follow these general guidelines:  Choose foods with a percent daily value for sodium of less than 5% (as listed on the food label).  Use salt-free seasonings or herbs instead of table salt or sea salt.  Check with your health care provider or pharmacist before using salt substitutes.  Eat lower-sodium products, often labeled as "lower sodium" or "no salt added."  Eat fresh foods.  Eat more vegetables, fruits, and low-fat dairy products.  Choose whole grains. Look for the word "whole" as the first word in the ingredient list.  Choose fish and skinless chicken or [place] more often than red meat. Limit fish, poultry, and meat to 6 oz (170 g) each day.  Limit sweets, desserts, sugars, and sugary drinks.  Choose heart-healthy fats.  Limit cheese to 1 oz (28 g) per day.  Eat more home-cooked food and less restaurant, buffet, and fast food.  Limit fried foods.  Cook foods using methods other than frying.  Limit canned vegetables. If you do use them, rinse them well to decrease the sodium.  When eating at a restaurant, ask that your food be prepared with less salt, or no salt if possible. WHAT FOODS CAN I  EAT? Seek help from a dietitian for individual calorie needs. Grains Whole grain or whole wheat bread. Brown rice. Whole grain or whole wheat pasta. Quinoa, bulgur, and whole grain cereals. Low-sodium cereals. Corn or whole wheat flour tortillas. Whole grain cornbread. Whole grain crackers. Low-sodium crackers. Vegetables Fresh or frozen vegetables (raw, steamed, roasted, or grilled). Low-sodium or reduced-sodium tomato and vegetable juices. Low-sodium or reduced-sodium tomato sauce and paste. Low-sodium or reduced-sodium canned vegetables.  Fruits All fresh, canned (in natural juice), or frozen fruits. Meat and Other Protein Products Ground beef (85% or leaner), grass-fed beef, or beef trimmed of fat. Skinless chicken or [place]. Ground chicken or [place]. Pork trimmed of fat. All fish and seafood. Eggs. Dried beans, peas, or lentils. Unsalted nuts and seeds. Unsalted canned beans. Dairy Low-fat dairy products, such as skim or 1% milk, 2% or reduced-fat cheeses, low-fat ricotta or cottage cheese, or plain low-fat yogurt.  Low-sodium or reduced-sodium cheeses. Fats and Oils Tub margarines without trans fats. Light or reduced-fat mayonnaise and salad dressings (reduced sodium). Avocado. Safflower, olive, or canola oils. Natural peanut or almond butter. Other Unsalted popcorn and pretzels. The items listed above may not be a complete list of recommended foods or beverages. Contact your dietitian for more options. WHAT FOODS ARE NOT RECOMMENDED? Grains White bread. White pasta. White rice. Refined cornbread. Bagels and croissants. Crackers that contain trans fat. Vegetables Creamed or fried vegetables. Vegetables in a cheese sauce. Regular canned vegetables. Regular canned tomato sauce and paste. Regular tomato and vegetable juices. Fruits Dried fruits. Canned fruit in light or heavy syrup. Fruit juice. Meat and Other Protein Products Fatty cuts of meat. Ribs, chicken wings, bacon, sausage,  bologna, salami, chitterlings, fatback, hot dogs, bratwurst, and packaged luncheon meats. Salted nuts and seeds. Canned beans with salt. Dairy Whole or 2% milk, cream, half-and-half, and cream cheese. Whole-fat or sweetened yogurt. Full-fat cheeses or blue cheese. Nondairy creamers and whipped toppings. Processed cheese, cheese spreads, or cheese curds. Condiments Onion and garlic salt, seasoned salt, table salt, and sea salt. Canned and packaged gravies. Worcestershire sauce. Tartar sauce. Barbecue sauce. Teriyaki sauce. Soy sauce, including reduced sodium. Steak sauce. Fish sauce. Oyster sauce. Cocktail sauce. Horseradish. Ketchup and mustard. Meat flavorings and tenderizers. Bouillon cubes. Hot sauce. Tabasco sauce. Marinades. Taco seasonings. Relishes. Fats and Oils Butter, stick margarine, lard, shortening, ghee, and bacon fat. Coconut, palm kernel, or palm oils. Regular salad dressings. Other Pickles and olives. Salted popcorn and pretzels. The items listed above may not be a complete list of foods and beverages to avoid. Contact your dietitian for more information. WHERE CAN I FIND MORE INFORMATION? National Heart, [employer], and [employer] Institute: travelstabloid.com Document Released: 04/12/2011 Document Revised: 09/07/2013 Document Reviewed: 02/25/2013 [facility] Patient Information 2015 [place], [place]. This information is not intended to replace advice given to you by your health care provider. Make sure you discuss any questions you have with your health care provider.

## 2014-02-16 ENCOUNTER — Encounter: Payer: Self-pay | Admitting: Family Medicine

## 2014-02-20 LAB — IFOBT (OCCULT BLOOD): IMMUNOLOGICAL FECAL OCCULT BLOOD TEST: NEGATIVE

## 2014-02-27 ENCOUNTER — Other Ambulatory Visit: Payer: Self-pay | Admitting: Family Medicine

## 2015-04-08 IMAGING — CR DG CHEST 2V
2 series · 2 of 2 positions shown · non-contrast
Comparison: None.

CLINICAL DATA: Annual physical exam.  Prior smoker for 17 years.

EXAM:
CHEST  2 VIEW

[PA]
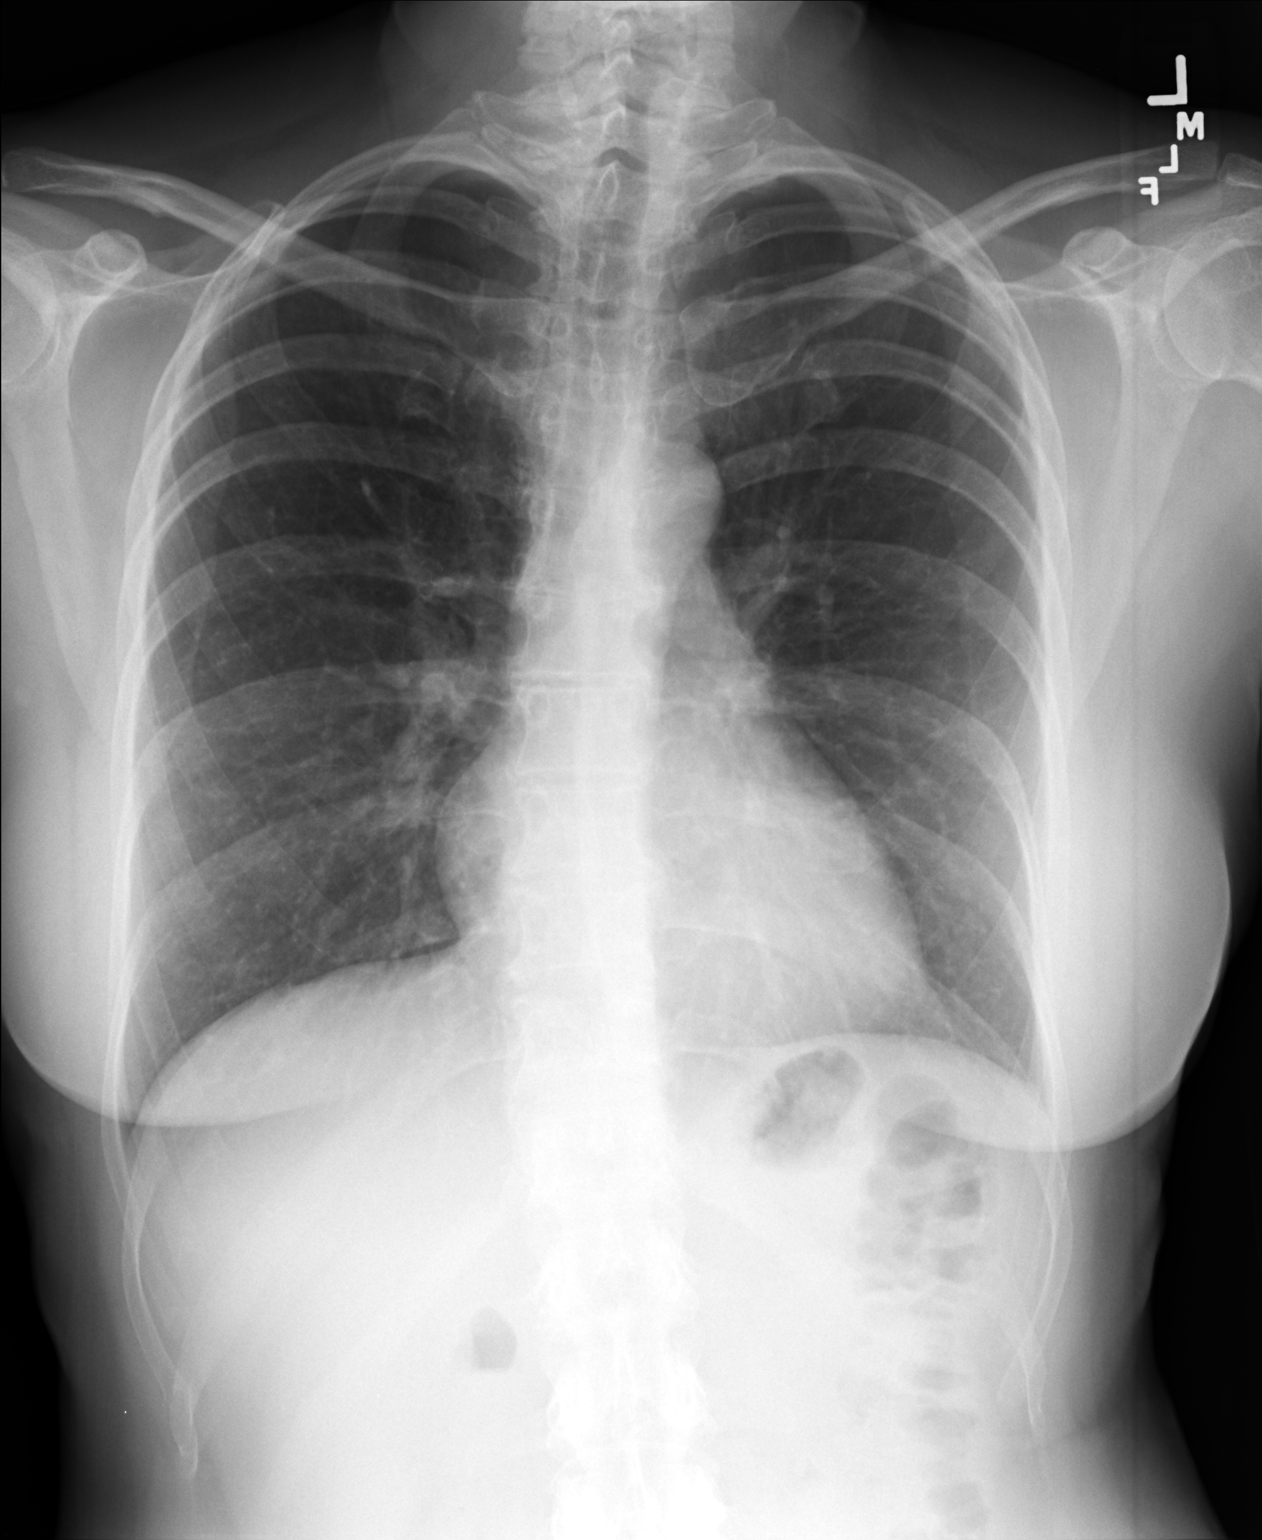

[lateral]
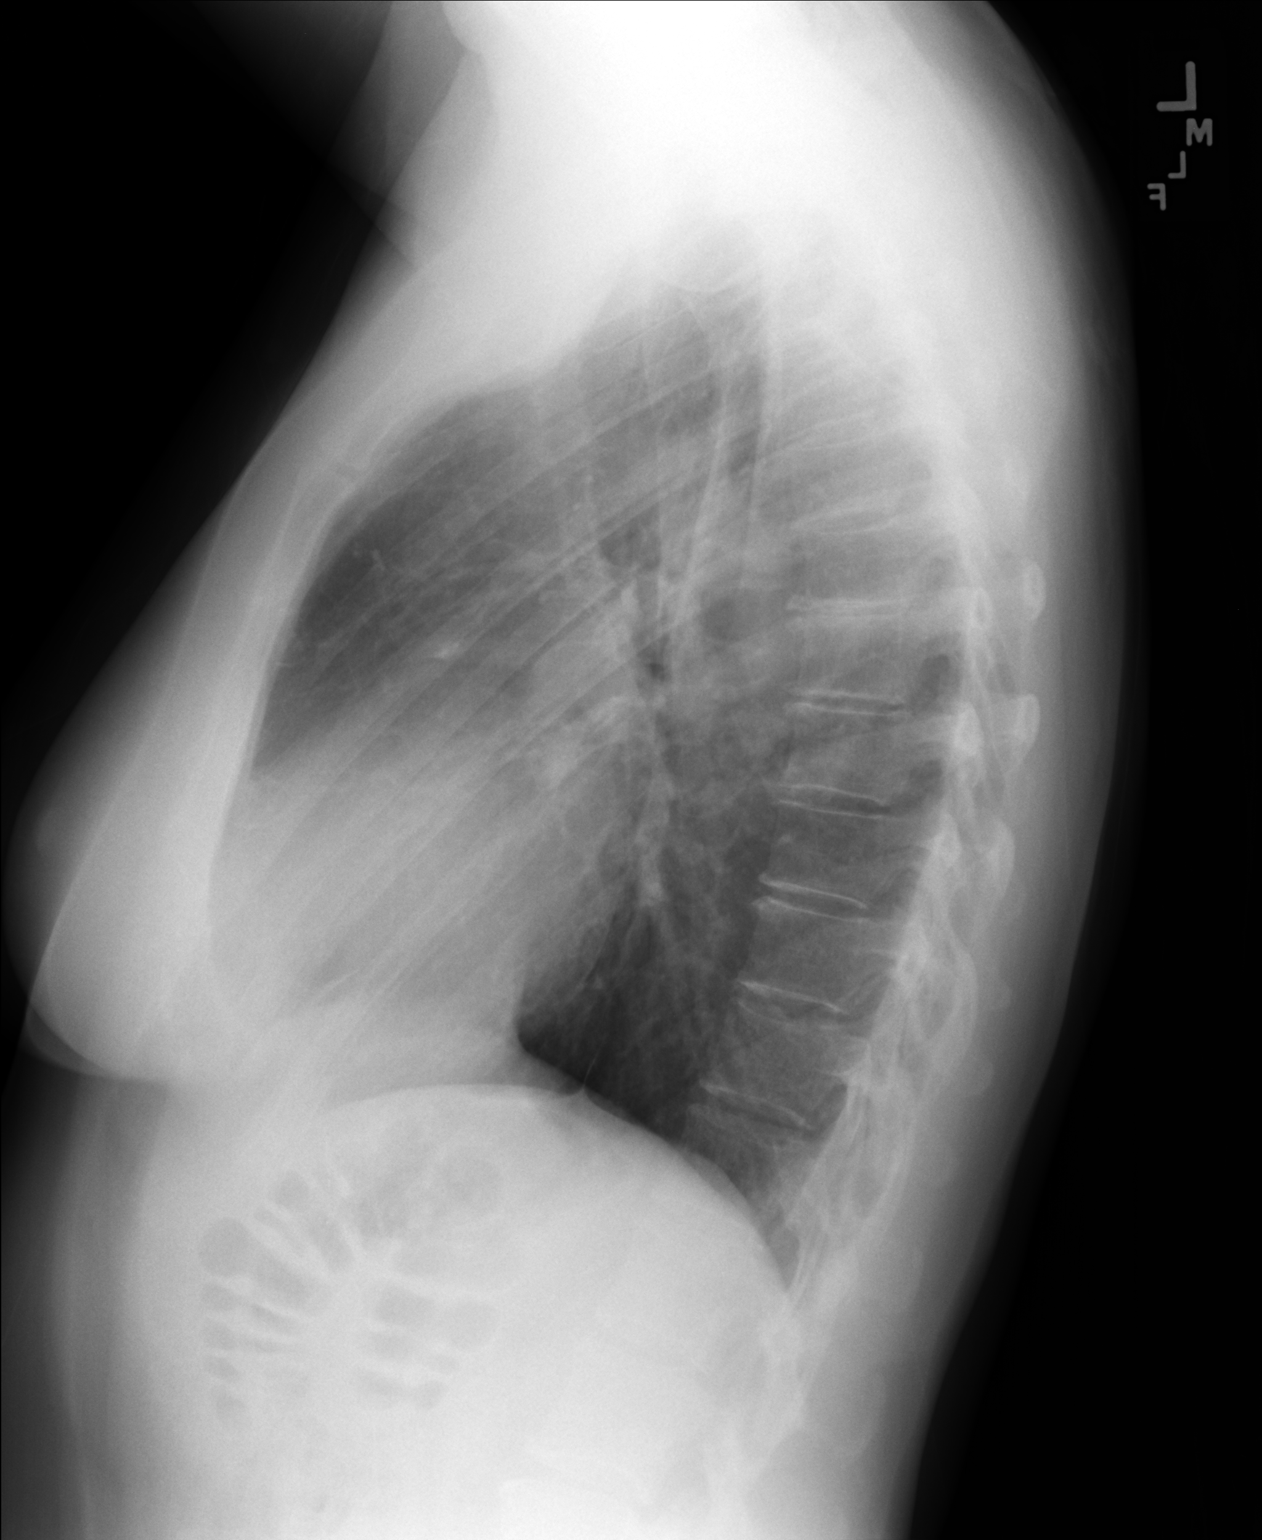

[2 of 2 positions shown; findings below may reference images not displayed]

FINDINGS: The heart size and mediastinal contours are within normal limits.
Both lungs are clear. The visualized skeletal structures are
unremarkable.
IMPRESSION: No active cardiopulmonary disease.

## 2015-09-30 DIAGNOSIS — D229 Melanocytic nevi, unspecified: Secondary | ICD-10-CM

## 2015-09-30 HISTORY — DX: Melanocytic nevi, unspecified: D22.9

## 2019-05-08 DIAGNOSIS — C439 Malignant melanoma of skin, unspecified: Secondary | ICD-10-CM

## 2019-05-08 HISTORY — DX: Malignant melanoma of skin, unspecified: C43.9

## 2019-12-17 ENCOUNTER — Ambulatory Visit: Payer: Self-pay | Admitting: Physician Assistant

## 2020-10-05 ENCOUNTER — Ambulatory Visit: Payer: Self-pay | Admitting: Physician Assistant

## 2020-10-19 ENCOUNTER — Ambulatory Visit: Payer: Self-pay | Admitting: Physician Assistant

## 2020-11-16 ENCOUNTER — Other Ambulatory Visit: Payer: Self-pay

## 2020-11-16 ENCOUNTER — Ambulatory Visit: Payer: Commercial Managed Care - PPO | Admitting: Physician Assistant

## 2020-11-16 ENCOUNTER — Encounter: Payer: Self-pay | Admitting: Physician Assistant

## 2020-11-16 DIAGNOSIS — Z85820 Personal history of malignant melanoma of skin: Secondary | ICD-10-CM | POA: Diagnosis not present

## 2020-11-16 DIAGNOSIS — Z1283 Encounter for screening for malignant neoplasm of skin: Secondary | ICD-10-CM | POA: Diagnosis not present

## 2020-11-16 DIAGNOSIS — L578 Other skin changes due to chronic exposure to nonionizing radiation: Secondary | ICD-10-CM

## 2020-11-16 DIAGNOSIS — D229 Melanocytic nevi, unspecified: Secondary | ICD-10-CM

## 2020-11-16 DIAGNOSIS — L814 Other melanin hyperpigmentation: Secondary | ICD-10-CM

## 2020-11-16 DIAGNOSIS — L821 Other seborrheic keratosis: Secondary | ICD-10-CM | POA: Diagnosis not present

## 2020-11-16 DIAGNOSIS — D1800 Hemangioma unspecified site: Secondary | ICD-10-CM

## 2020-11-16 NOTE — Progress Notes (Addendum)
   Follow-Up Visit   Subjective  Diana Krueger is a 60 y.o. female who presents for the following: Annual Exam (MM ON BACK [facility]ORD[facility] DR [facility] [place] =[place] EXC/Patient history in the chart moderate atypia left breast 2017). Patient will attempt to attain notes, pathology report and surgical procedure notes. Otherwise she has no complaints.   The following portions of the chart were reviewed this encounter and updated as appropriate:  Tobacco  Allergies  Meds  Problems  Med Hx  Surg Hx  Fam Hx       Objective  Well appearing patient in no apparent distress; mood and affect are within normal limits.  A full examination was performed including scalp, head, eyes, ears, nose, lips, neck, chest, axillae, abdomen, back, buttocks, bilateral upper extremities, bilateral lower extremities, hands, feet, fingers, toes, fingernails, and toenails. All findings within normal limits unless otherwise noted below.  Left Upper Back SCAR FROM MM SURGERY - it is clear of any pigment. MM ON BACK FLORDIA DR [facility] [place] NEED PATH REPORT        Assessment & Plan  Skin exam for malignant neoplasm Left Upper Back  Obtain pathology report. Biannual skin exams Lentigines - Scattered tan macules - Discussed due to sun exposure - Benign, observe - Call for any changes  Seborrheic Keratoses - Stuck-on, waxy, tan-brown papules and plaques  - Discussed benign etiology and prognosis. - Observe - Call for any changes  Melanocytic Nevi - Tan-brown and/or pink-flesh-colored symmetric macules and papules - Benign appearing on exam today - Observation - Call clinic for new or changing moles - Recommend daily use of broad spectrum spf 30+ sunscreen to sun-exposed areas.   Hemangiomas - Red papules - Discussed benign nature - Observe - Call for any changes  Actinic Damage - diffuse scaly erythematous macules with underlying dyspigmentation - Recommend daily  broad spectrum sunscreen SPF 30+ to sun-exposed areas, reapply every 2 hours as needed.  - Call for new or changing lesions.  Skin cancer screening performed today.   I, Milea Klink, PA-C, have reviewed all documentation's for this visit.  The documentation on 11/16/20 for the exam, diagnosis, procedures and orders are all accurate and complete.

## 2021-05-25 ENCOUNTER — Ambulatory Visit: Payer: Commercial Managed Care - PPO | Admitting: Physician Assistant

## 2021-11-16 ENCOUNTER — Encounter: Payer: Self-pay | Admitting: Physician Assistant

## 2021-11-16 ENCOUNTER — Ambulatory Visit: Payer: Commercial Managed Care - PPO | Admitting: Physician Assistant

## 2021-11-16 DIAGNOSIS — Z86018 Personal history of other benign neoplasm: Secondary | ICD-10-CM | POA: Diagnosis not present

## 2021-11-16 DIAGNOSIS — Z808 Family history of malignant neoplasm of other organs or systems: Secondary | ICD-10-CM

## 2021-11-16 DIAGNOSIS — L710 Perioral dermatitis: Secondary | ICD-10-CM

## 2021-11-16 DIAGNOSIS — Z1283 Encounter for screening for malignant neoplasm of skin: Secondary | ICD-10-CM

## 2021-11-16 MED ORDER — MINOCYCLINE HCL 100 MG PO CAPS: 100.0000 mg | ORAL_CAPSULE | Freq: Every day | ORAL | 6 refills | Status: AC

## 2021-11-16 MED ORDER — MINOCYCLINE HCL 100 MG PO CAPS: 100.0000 mg | ORAL_CAPSULE | Freq: Every day | ORAL | 6 refills | Status: DC

## 2021-11-16 MED ORDER — ALCLOMETASONE DIPROPIONATE 0.05 % EX CREA: TOPICAL_CREAM | Freq: Two times a day (BID) | CUTANEOUS | 3 refills | Status: DC | PRN

## 2021-11-16 MED ORDER — ALCLOMETASONE DIPROPIONATE 0.05 % EX CREA: TOPICAL_CREAM | Freq: Two times a day (BID) | CUTANEOUS | 3 refills | Status: AC | PRN

## 2021-11-16 NOTE — Patient Instructions (Signed)

## 2021-12-07 ENCOUNTER — Encounter: Payer: Self-pay | Admitting: Physician Assistant

## 2021-12-07 NOTE — Progress Notes (Signed)
   Follow-Up Visit   Subjective  Diana Krueger is a 61 y.o. female who presents for the following: Annual Exam (Patient here today for yearly skin check, per patient she would like something for her face. Per patient she developed rosacea during COVID and now her face is dry. Personal history and family history of atypical moles, and melanoma.).   The following portions of the chart were reviewed this encounter and updated as appropriate:  Tobacco  Allergies  Meds  Problems  Med Hx  Surg Hx  Fam Hx      Objective  Well appearing patient in no apparent distress; mood and affect are within normal limits.  A full examination was performed including scalp, head, eyes, ears, nose, lips, neck, chest, axillae, abdomen, back, buttocks, bilateral upper extremities, bilateral lower extremities, hands, feet, fingers, toes, fingernails, and toenails. All findings within normal limits unless otherwise noted below.  Full body skin examination-No atypical nevi or signs of NMSC noted at the time of the visit.    Left Buccal Cheek, Right Buccal Cheek Thin scaly erythematous papules coalescing to plaques.    Assessment & Plan  Encounter for screening for malignant neoplasm of skin  Yearly skin exam  Perioral dermatitis Left Buccal Cheek; Right Buccal Cheek  alclomethasone (ACLOVATE) 0.05 % cream - Left Buccal Cheek, Right Buccal Cheek Apply topically 2 (two) times daily as needed (Rash).  minocycline (MINOCIN) 100 MG capsule - Left Buccal Cheek, Right Buccal Cheek Take 1 capsule (100 mg total) by mouth daily.    I, Ommie Degeorge, PA-C, have reviewed all documentation's for this visit.  The documentation on 12/07/21 for the exam, diagnosis, procedures and orders are all accurate and complete.

## 2022-02-27 ENCOUNTER — Ambulatory Visit: Payer: Commercial Managed Care - PPO | Admitting: Physician Assistant
